# Patient Record
Sex: Male | Born: 1965 | Race: Black or African American | Hispanic: No | Marital: Single | Smoking: Former smoker
Health system: Southern US, Community
[De-identification: ages and names within clinical notes are randomized; demographics above are authoritative.]

## PROBLEM LIST (undated history)

## (undated) DIAGNOSIS — I10 Essential (primary) hypertension: Secondary | ICD-10-CM

---

## 1997-07-24 ENCOUNTER — Emergency Department (HOSPITAL_COMMUNITY): Admission: EM | Admit: 1997-07-24 | Discharge: 1997-07-24 | Payer: Self-pay | Admitting: Emergency Medicine

## 1997-07-27 ENCOUNTER — Emergency Department (HOSPITAL_COMMUNITY): Admission: EM | Admit: 1997-07-27 | Discharge: 1997-07-27 | Payer: Self-pay | Admitting: Emergency Medicine

## 2014-05-20 ENCOUNTER — Ambulatory Visit: Payer: Self-pay | Admitting: Dietician

## 2014-06-19 ENCOUNTER — Ambulatory Visit: Payer: Self-pay | Admitting: Dietician

## 2014-07-10 ENCOUNTER — Emergency Department (HOSPITAL_COMMUNITY)
Admission: EM | Admit: 2014-07-10 | Discharge: 2014-07-10 | Disposition: A | Discharge status: Home / Self Care | Payer: PRIVATE HEALTH INSURANCE | Attending: Emergency Medicine | Admitting: Emergency Medicine

## 2014-07-10 ENCOUNTER — Encounter (HOSPITAL_COMMUNITY): Payer: Self-pay | Admitting: Emergency Medicine

## 2014-07-10 DIAGNOSIS — I10 Essential (primary) hypertension: Secondary | ICD-10-CM | POA: Diagnosis not present

## 2014-07-10 DIAGNOSIS — Z87891 Personal history of nicotine dependence: Secondary | ICD-10-CM | POA: Diagnosis not present

## 2014-07-10 DIAGNOSIS — K088 Other specified disorders of teeth and supporting structures: Secondary | ICD-10-CM | POA: Insufficient documentation

## 2014-07-10 DIAGNOSIS — Z792 Long term (current) use of antibiotics: Secondary | ICD-10-CM | POA: Diagnosis not present

## 2014-07-10 DIAGNOSIS — K029 Dental caries, unspecified: Secondary | ICD-10-CM | POA: Diagnosis not present

## 2014-07-10 HISTORY — DX: Essential (primary) hypertension: I10

## 2014-07-10 MED ORDER — PENICILLIN V POTASSIUM 500 MG PO TABS: 500.0000 mg | ORAL_TABLET | Freq: Four times a day (QID) | ORAL | Status: AC

## 2014-07-10 MED ORDER — OXYCODONE-ACETAMINOPHEN 5-325 MG PO TABS
2.0000 | ORAL_TABLET | Freq: Once | ORAL | Status: AC
  Administered 2014-07-10: 2 via ORAL
  Filled 2014-07-10: qty 2

## 2014-07-10 NOTE — ED Provider Notes (Signed)
CSN: [id number]     Arrival date & time 07/10/14  [id number] History   First MD Initiated Contact with Patient 07/10/14 [phone]     Chief Complaint  Patient presents with  . Dental Pain  . Hypertension     (Consider location/radiation/quality/duration/timing/severity/associated sxs/prior Treatment) Patient is a 49 y.o. male presenting with tooth pain.  Dental Pain Location:  Upper Upper teeth location:  2/RU 2nd molar Quality:  Dull Severity:  Severe Onset quality:  Gradual Duration:  2 days Timing:  Constant Progression:  Worsening Chronicity:  New Context: dental caries   Context: not abscess   Relieved by:  Nothing Worsened by:  Hot food/drink, touching, jaw movement and pressure Associated symptoms: facial pain   Associated symptoms: no congestion, no fever and no trismus     Past Medical History  Diagnosis Date  . Hypertension    History reviewed. No pertinent past surgical history. No family history on file. History  Substance Use Topics  . Smoking status: Former Smoker    Quit date: 06/15/2014  . Smokeless tobacco: Not on file  . Alcohol Use: 7.2 oz/week    10 Cans of beer, 2 Shots of liquor per week    Review of Systems  Constitutional: Negative for fever.  HENT: Negative for congestion.   All other systems reviewed and are negative.     Allergies  Review of patient's allergies indicates no known allergies.  Home Medications   Prior to Admission medications   Medication Sig Start Date End Date Taking? Authorizing Provider  penicillin v potassium (VEETID) 500 MG tablet Take 1 tablet (500 mg total) by mouth 4 (four) times daily. 07/10/14 07/17/14  Mirian MoMatthew Constantin Hillery, MD   BP 150/107 mmHg  Pulse 76  Temp(Src) 98.7 F (37.1 C) (Oral)  Resp 22  Ht 5\' 9"  (1.753 m)  Wt 220 lb (99.791 kg)  BMI 32.47 kg/m2  SpO2 95% Physical Exam  Constitutional: He is oriented to person, place, and time. He appears well-developed and well-nourished.  HENT:  Head: Normocephalic  and atraumatic.  Mouth/Throat: No trismus in the jaw. Dental caries present. No dental abscesses.  Eyes: Conjunctivae and EOM are normal.  Neck: Normal range of motion. Neck supple.  Cardiovascular: Normal rate, regular rhythm and normal heart sounds.   Pulmonary/Chest: Effort normal and breath sounds normal. No respiratory distress.  Abdominal: He exhibits no distension. There is no tenderness. There is no rebound and no guarding.  Musculoskeletal: Normal range of motion.  Neurological: He is alert and oriented to person, place, and time.  Skin: Skin is warm and dry.  Vitals reviewed.   ED Course  Procedures (including critical care time) Labs Review Labs Reviewed - No data to display  Imaging Review No results found.   EKG Interpretation None      MDM   Final diagnoses:  Pain due to dental caries    49 y.o. male with pertinent PMH of HTN presents with uncomplicated dental pain as above.  Physical exam reassuring.  Mild HTN, normal given pt experiencing pain.  No signs of end organ damage on history or exam.  No signs of abscess or other complicating factor.  Offered dental block and refused.  Percocet given in ED, discussed departmental policy for no prescription.  DC home in stable condition.    I have reviewed all laboratory and imaging studies if ordered as above  1. Pain due to dental caries         Mirian MoMatthew Keilin Gamboa, MD 07/10/14  0728 

## 2014-07-10 NOTE — ED Notes (Signed)
Pt reports dental pain to R upper rear molar, 10/10.  Pt reports headaches r/t recently high BP, pt reports taking hypertension meds regularly. NAD noted at this time.

## 2014-07-10 NOTE — Discharge Instructions (Signed)
Dental Caries °Dental caries (also called tooth decay) is the most common oral disease. It can occur at any age but is more common in children and young adults.  °HOW DENTAL CARIES DEVELOPS  °The process of decay begins when bacteria and foods (particularly sugars and starches) combine in your mouth to produce plaque. Plaque is a substance that sticks to the hard, outer surface of a tooth (enamel). The bacteria in plaque produce acids that attack enamel. These acids may also attack the root surface of a tooth (cementum) if it is exposed. Repeated attacks dissolve these surfaces and create holes in the tooth (cavities). If left untreated, the acids destroy the other layers of the tooth.  °RISK FACTORS °· Frequent sipping of sugary beverages.   °· Frequent snacking on sugary and starchy foods, especially those that easily get stuck in the teeth.   °· Poor oral hygiene.   °· Dry mouth.   °· Substance abuse such as methamphetamine abuse.   °· Broken or poor-fitting dental restorations.   °· Eating disorders.   °· Gastroesophageal reflux disease (GERD).   °· Certain radiation treatments to the head and neck. °SYMPTOMS °In the early stages of dental caries, symptoms are seldom present. Sometimes white, chalky areas may be seen on the enamel or other tooth layers. In later stages, symptoms may include: °· Pits and holes on the enamel. °· Toothache after sweet, hot, or cold foods or drinks are consumed. °· Pain around the tooth. °· Swelling around the tooth. °DIAGNOSIS  °Most of the time, dental caries is detected during a regular dental checkup. A diagnosis is made after a thorough medical and dental history is taken and the surfaces of your teeth are checked for signs of dental caries. Sometimes special instruments, such as lasers, are used to check for dental caries. Dental X-ray exams may be taken so that areas not visible to the eye (such as between the contact areas of the teeth) can be checked for cavities.    °TREATMENT  °If dental caries is in its early stages, it may be reversed with a fluoride treatment or an application of a remineralizing agent at the dental office. Thorough brushing and flossing at home is needed to aid these treatments. If it is in its later stages, treatment depends on the location and extent of tooth destruction:  °· If a small area of the tooth has been destroyed, the destroyed area will be removed and cavities will be filled with a material such as gold, silver amalgam, or composite resin.   °· If a large area of the tooth has been destroyed, the destroyed area will be removed and a cap (crown) will be fitted over the remaining tooth structure.   °· If the center part of the tooth (pulp) is affected, a procedure called a root canal will be needed before a filling or crown can be placed.   °· If most of the tooth has been destroyed, the tooth may need to be pulled (extracted). °HOME CARE INSTRUCTIONS °You can prevent, stop, or reverse dental caries at home by practicing good oral hygiene. Good oral hygiene includes: °· Thoroughly cleaning your teeth at least twice a day with a toothbrush and dental floss.   °· Using a fluoride toothpaste. A fluoride mouth rinse may also be used if recommended by your dentist or health care provider.   °· Restricting the amount of sugary and starchy foods and sugary liquids you consume.   °· Avoiding frequent snacking on these foods and sipping of these liquids.   °· Keeping regular visits with   a dentist for checkups and cleanings. °PREVENTION  °· Practice good oral hygiene. °· Consider a dental sealant. A dental sealant is a coating material that is applied by your dentist to the pits and grooves of teeth. The sealant prevents food from being trapped in them. It may protect the teeth for several years. °· Ask about fluoride supplements if you live in a community without fluorinated water or with water that has a low fluoride content. Use fluoride supplements  as directed by your dentist or health care provider. °· Allow fluoride varnish applications to teeth if directed by your dentist or health care provider. °Document Released: 11/13/2001 Document Revised: 07/08/2013 Document Reviewed: 02/24/2012 °ExitCare® Patient Information ©2015 [employer]. This information is not intended to replace advice given to you by your health care provider. Make sure you discuss any questions you have with your health care provider. ° °Dental Pain °A tooth ache may be caused by cavities (tooth decay). Cavities expose the nerve of the tooth to air and hot or cold temperatures. It may come from an infection or abscess (also called a boil or furuncle) around your tooth. It is also often caused by dental caries (tooth decay). This causes the pain you are having. °DIAGNOSIS  °Your caregiver can diagnose this problem by exam. °TREATMENT  °· If caused by an infection, it may be treated with medications which kill germs (antibiotics) and pain medications as prescribed by your caregiver. Take medications as directed. °· Only take over-the-counter or prescription medicines for pain, discomfort, or fever as directed by your caregiver. °· Whether the tooth ache today is caused by infection or dental disease, you should see your dentist as soon as possible for further care. °SEEK MEDICAL CARE IF: °The exam and treatment you received today has been provided on an emergency basis only. This is not a substitute for complete medical or dental care. If your problem worsens or new problems (symptoms) appear, and you are unable to meet with your dentist, call or return to this location. °SEEK IMMEDIATE MEDICAL CARE IF:  °· You have a fever. °· You develop redness and swelling of your face, jaw, or neck. °· You are unable to open your mouth. °· You have severe pain uncontrolled by pain medicine. °MAKE SURE YOU:  °· Understand these instructions. °· Will watch your condition. °· Will get help right away if  you are not doing well or get worse. °Document Released: 02/21/2005 Document Revised: 05/16/2011 Document Reviewed: 10/10/2007 °ExitCare® Patient Information ©2015 [employer]. This information is not intended to replace advice given to you by your health care provider. Make sure you discuss any questions you have with your health care provider. ° °Emergency Department Resource Guide °1) Find a Doctor and Pay Out of Pocket °Although you won't have to find out who is covered by your insurance plan, it is a good idea to ask around and get recommendations. You will then need to call the office and see if the doctor you have chosen will accept you as a new patient and what types of options they offer for patients who are self-pay. Some doctors offer discounts or will set up payment plans for their patients who do not have insurance, but you will need to ask so you aren't surprised when you get to your appointment. ° °2) Contact Your Local Health Department °Not all health departments have doctors that can see patients for sick visits, but many do, so it is worth a call to see   if yours does. If you don't know where your local health department is, you can check in your phone book. The CDC also has a tool to help you locate your state's health department, and many state websites also have listings of all of their local health departments. ° °3) Find a Walk-in Clinic °If your illness is not likely to be very severe or complicated, you may want to try a walk in clinic. These are popping up all over the country in pharmacies, drugstores, and shopping centers. They're usually staffed by nurse practitioners or physician assistants that have been trained to treat common illnesses and complaints. They're usually fairly quick and inexpensive. However, if you have serious medical issues or chronic medical problems, these are probably not your best option. ° °No Primary Care Doctor: °- Call [facility] at  832-8000 - they can  help you locate a primary care doctor that  accepts your insurance, provides certain services, etc. °- Physician Referral Service- 1-800-533-3463 ° °Chronic Pain Problems: °Organization         Address  Phone   Notes  °[facility]  (336) 297-2271 Patients need to be referred by their primary care doctor.  ° °Medication Assistance: °Organization         Address  Phone   Notes  °[place] [facility] [address] °[place], [place] [address] (336) 641-8030 --Must be a resident of [place] °-- Must have NO insurance coverage whatsoever (no Medicaid/ Medicare, etc.) °-- The pt. MUST have a primary care doctor that directs their care regularly and follows them in the community °  °[facility]  (866) 331-1348   °[employer]  (888) 892-1162   ° °Agencies that provide inexpensive medical care: °Organization         Address  Phone   Notes  °[facility]  (336) 832-8035   °[facility]    (336) 832-7272   °[facility] [address] °[place], [place] [address] (336) 832-4777   °[facility] [address], °[place] (336) 271-4999   °[facility]    (336) 373-0678   °[facility]    (336) 272-1050   °[facility] ° [address], [place] Phone:  (336) 832-4444, Fax:  (336) 832-4440 Hours of Operation:  9 am - 6 pm, M-F.  Also accepts Medicaid/Medicare and self-pay.  °[facility] ° [address], [place] Phone: (336) 832-3150, Fax: (336) 832-3151. Hours of Operation:  8:30 am - 5:30 pm, M-F.  Also accepts Medicaid and self-pay.  °[facility] [place] [address], [place] Phone: (336) 878-6027   °[facility] [address], [place], [place] (336)723-1848, Ext. 123 Mondays & Thursdays: 7-9 AM.  First 15 patients are seen on a first come, first serve basis. °  ° °Medicaid-accepting [place] Providers: ° °Organization         Address  Phone   Notes  °[facility] [address], [place] (336) 641-2100 Also accepts self-pay patients.  °[facility] [address], [place] ° (336) 856-9996   °[facility] [address], [place] (336) 288-8857   °[facility] [address] [place] [address], [place] (336) 299-7000   °Veita Bland [address], [place]  ° (  336) 373-1557 Only accepts [place] Access Medicaid patients after they have their name applied to their card.  ° °Self-Pay (no insurance) in [place]: ° °Organization         Address  Phone   Notes  °Sickle Cell Patients, [facility] [address], [place] (336) 832-1970   °[facility] [address], [place] (336) 832-4400   °[facility] ° [address], [place] (336) 992-4800   °[facility]/Dr. Osei-Bonsu ° [address] [place] [address], [place] or [address], [place] (336) 841-8500 Phone number for both [place] and [place] locations is the same.  °[facility] [address], [place] (336) 299-0000   °[facility] [address] [place] [address], [place] or [address] (336) 852-7530 °(336) 878-2260   °[facility] [address], [place] (336) 350-1642, phone; (336) 294-5005, fax Sees patients 1st and 3rd Saturday of every month.  Must not qualify for public or private insurance (i.e. Medicaid, Medicare, [place] Health Choice, Veterans' Benefits) • Household income should be no more than 200% of the poverty level •The clinic cannot treat you if you are pregnant or think you are pregnant • Sexually transmitted diseases are not treated at the clinic.  ° ° °Dental Care: °Organization         Address  Phone  Notes  °[place] [facility]  [facility] [address], [place] (336) 641-6152 Accepts children up to age 21 who are enrolled in Medicaid or [place] Health Choice; pregnant women with a Medicaid card; and children who have applied for Medicaid or [place] Health Choice, but were declined, whose parents can pay a reduced fee at time of service.  °[facility] [place]  [address], [place] (336) 641-7733 Accepts children up to age 21 who are enrolled in Medicaid or [place] Health Choice; pregnant women with a Medicaid card; and children who have applied for Medicaid or [place] Health Choice, but were declined, whose parents can pay a reduced fee at time of service.  °[facility] ° [address], [place] (336) 641-4533 Patients are seen by appointment only. Walk-ins are not accepted. [facility] will see patients 18 years of age and older. °Monday - Tuesday (8am-5pm) °Most Wednesdays (8:30-5pm) °$30 per visit, cash only  °[facility] ° [address], [place] (336) 641-4533 Patients are seen by appointment only. Walk-ins are not accepted. [facility] will see patients 18 years of age and older. °One Wednesday Evening (Monthly: Volunteer Based).  $30 per visit, cash only  °[facility]  (919) 537-3737 for adults; Children under age 4, call Graduate Pediatric Dentistry at (919) [phone]. Children aged 4-14, please call (919) [phone] to request a pediatric application. ° Dental services are provided in all areas of dental care including fillings, crowns and bridges, complete and partial dentures, implants, gum treatment, root canals, and extractions. Preventive care is also provided. Treatment is provided to both adults and children. °Patients are selected via a lottery and there is often a waiting list. °  °[facility] [address], °[place] ° (336) 763-8833 [email or web address] °  °[facility] [address], [place],  (336)723-1848, Ext. 123 Second and Fourth Thursday of each month, opens at 6:30   AM; Clinic ends at 9 AM.  Patients are seen on a first-come first-served basis, and a limited number are seen during each clinic.  ° °[facility] ° [address], [place], [place] (336) 723-7904   Eligibility Requirements °You must have lived in [place], [place], or [place] counties for at least the last three months. °  You cannot be eligible for state or federal sponsored healthcare insurance, including Veterans Administration, Medicaid, or Medicare. °  You generally cannot be eligible for healthcare insurance through your employer.  °  How to apply: °Eligibility screenings are held every Tuesday and Wednesday afternoon from 1:00 pm until 4:00 pm. You do not need an appointment for the interview!  °[facility] [address], [place], [place] 336-631-2330   °[facility]  336-342-8273   °[facility]  336-703-3100   °[facility]  336-570-6415   ° °Behavioral Health Resources in the Community: °Intensive Outpatient Programs °Organization         Address  Phone  Notes  °[facility] [address], [place], [place] 336-878-6098   °[facility] [address], [place], [place] 336-832-9800   °[facility] [address], [place], [place] ° 336-882-2125   °[facility] [address],  °[place], [place] 1-800-853-5163 or 336-641-4981   °Substance Abuse Resources °Organization         Address  Phone  Notes  °[facility]  336-882-2125   °[facility]  336-784-9470   °[facility]  336-285-9073   °[facility]  336-845-3988   °Residential & Outpatient Substance Abuse Program  1-800-659-3381   °Psychological Services °Organization         Address  Phone  Notes  °[facility]  336-  832-9600   °[facility]  336- 378-7881   °[facility] [address], [place] 1-800-853-5163 or 336-641-4981   ° °Mobile Crisis Teams °Organization         Address  Phone  Notes  °[facility], Mobile Crisis Care Unit  1-877-626-1772   °Assertive °[facility] ° [address] [place], [place] 336-834-9664   °Sharon DeEsch [address] °[place] 336-554-5454   ° °Self-Help/Support Groups °Organization         Address  Phone             Notes  °[facility] - variety of support groups  336- 373-1402 Call for more information  °Narcotics Anonymous (NA), [facility] [address], °[place]  2 meetings at this location  ° °Residential Treatment Programs °Organization         Address  Phone  Notes  °[facility] [address],    °[place]  1-866-801-8205   °[facility] ° [address], [place], [place] 704-293-8524   °[facility] [address], [place] 336-845-3988 Admissions: 8am-3pm M-F  °[facility] [address]    °[place], [place] 336-841-1104   °[facility] [address], [place], [place] 336-379-7146   °[facility] [address]  °[place], [place] 336-285-9073   °[facility] - Intensive Outpatient [address], , [place] 336-852-3033   °[facility] [address]  °[place], [place] 1-877-615-2722 or 336-784-9470   °  [facility] [address] [place], [place] 336-227-7417 Accepts Medicaid  °[facility] [address]  °[place] 1-800-659-3381 Substance Abuse/Addiction Treatment  ° °[place] Behavioral Health Resources °Organization         Address  Phone  Notes  °[facility]  (888) 581-9988   °Julie Brannon, PhD [address] [place], [place]   (336) 349-5553 or  (336) 951-0000   °[facility]   [address] °[place], [place] (336) 349-4454   °[facility] [address], [place], [place] (336) 342-8316 Insurance/Medicaid/sponsorship through [facility]  °[facility] [address]                                    [place], [place] (336) 342-8316 Therapy/tele-psych/case  °[facility] [address]  ° [place],  (336) 349-2233    °Dr. Arfeen  (336) 349-4544   °[facility] [place]  [employer] [place] [facility] 1) [address],  °2) [address], [place] °3)  [address], [place] (336) 349-3220 °(336) 342-7768 ° °(336) 342-8140   °[place] [facility] (336) 342-1394 or (336) 342-3537 (After Hours)    ° °

## 2014-07-11 ENCOUNTER — Ambulatory Visit: Payer: Self-pay | Admitting: Dietician

## 2014-07-25 ENCOUNTER — Ambulatory Visit: Payer: Self-pay | Admitting: *Deleted

## 2014-08-25 ENCOUNTER — Ambulatory Visit: Payer: Self-pay | Admitting: Dietician

## 2016-11-09 ENCOUNTER — Encounter (HOSPITAL_COMMUNITY): Payer: Self-pay | Admitting: *Deleted

## 2016-11-09 ENCOUNTER — Emergency Department (HOSPITAL_COMMUNITY)
Admission: EM | Admit: 2016-11-09 | Discharge: 2016-11-09 | Disposition: A | Discharge status: Home / Self Care | Payer: Managed Care, Other (non HMO) | Attending: Emergency Medicine | Admitting: Emergency Medicine

## 2016-11-09 ENCOUNTER — Emergency Department (HOSPITAL_COMMUNITY): Payer: Managed Care, Other (non HMO)

## 2016-11-09 DIAGNOSIS — I10 Essential (primary) hypertension: Secondary | ICD-10-CM | POA: Diagnosis not present

## 2016-11-09 DIAGNOSIS — Z87891 Personal history of nicotine dependence: Secondary | ICD-10-CM | POA: Insufficient documentation

## 2016-11-09 DIAGNOSIS — W01198A Fall on same level from slipping, tripping and stumbling with subsequent striking against other object, initial encounter: Secondary | ICD-10-CM | POA: Insufficient documentation

## 2016-11-09 DIAGNOSIS — Y929 Unspecified place or not applicable: Secondary | ICD-10-CM | POA: Insufficient documentation

## 2016-11-09 DIAGNOSIS — S5002XA Contusion of left elbow, initial encounter: Secondary | ICD-10-CM | POA: Diagnosis not present

## 2016-11-09 DIAGNOSIS — S20211A Contusion of right front wall of thorax, initial encounter: Secondary | ICD-10-CM | POA: Insufficient documentation

## 2016-11-09 DIAGNOSIS — W19XXXA Unspecified fall, initial encounter: Secondary | ICD-10-CM

## 2016-11-09 DIAGNOSIS — Y9389 Activity, other specified: Secondary | ICD-10-CM | POA: Insufficient documentation

## 2016-11-09 DIAGNOSIS — Y999 Unspecified external cause status: Secondary | ICD-10-CM | POA: Insufficient documentation

## 2016-11-09 DIAGNOSIS — S4992XA Unspecified injury of left shoulder and upper arm, initial encounter: Secondary | ICD-10-CM | POA: Diagnosis present

## 2016-11-09 MED ORDER — NAPROXEN 500 MG PO TABS: 500.0000 mg | ORAL_TABLET | Freq: Two times a day (BID) | ORAL | 0 refills | Status: AC

## 2016-11-09 MED ORDER — CYCLOBENZAPRINE HCL 10 MG PO TABS: 10.0000 mg | ORAL_TABLET | Freq: Two times a day (BID) | ORAL | 0 refills | Status: AC | PRN

## 2016-11-09 NOTE — ED Triage Notes (Signed)
Pt reports tripping and falling yesterday in his yard. Has swelling and severe pain to left upper arm and right side abd pain. Denies difficulty urinating today. Ambulatory at triage.

## 2016-11-09 NOTE — Discharge Instructions (Signed)
X-rays showed no fracture.  You will be sore for several days.  Prescription for pain med and muscle relaxer.

## 2016-11-09 NOTE — ED Notes (Signed)
Patient transported to X-ray 

## 2016-11-09 NOTE — ED Provider Notes (Signed)
[facility]-EMERGENCY DEPT Provider Note   CSN: [id number] Arrival date & time: 11/09/16  [id number]     History   Chief Complaint Chief Complaint  Patient presents with  . Fall    HPI Margarette CanadaMichael T Novakowski is a 51 y.o. male.  Accidental trip and fal while working in his yard. Patient complains of pain in his left elbow and right ribs. No head or neck trauma.No other extremity injury. Severity of symptoms is moderate. Nothing makes symptoms better or worse.      Past Medical History:  Diagnosis Date  . Hypertension     There are no active problems to display for this patient.   History reviewed. No pertinent surgical history.     Home Medications    Prior to Admission medications   Medication Sig Start Date End Date Taking? Authorizing Provider  cyclobenzaprine (FLEXERIL) 10 MG tablet Take 1 tablet (10 mg total) by mouth 2 (two) times daily as needed for muscle spasms. 11/09/16   Donnetta Hutchingook, Lyana Asbill, MD  naproxen (NAPROSYN) 500 MG tablet Take 1 tablet (500 mg total) by mouth 2 (two) times daily. 11/09/16   Donnetta Hutchingook, Tykera Skates, MD    Family History History reviewed. No pertinent family history.  Social History Social History  Substance Use Topics  . Smoking status: Former Smoker    Quit date: 06/15/2014  . Smokeless tobacco: Not on file  . Alcohol use 7.2 oz/week    10 Cans of beer, 2 Shots of liquor per week     Allergies   Patient has no known allergies.   Review of Systems Review of Systems   Physical Exam Updated Vital Signs BP (!) 140/95   Pulse 82   Temp 98.1 F (36.7 C) (Oral)   Resp 16   SpO2 99%   Physical Exam  Constitutional: He is oriented to person, place, and time. He appears well-developed and well-nourished.  HENT:  Head: Normocephalic and atraumatic.  Eyes: Conjunctivae are normal.  Neck: Neck supple.  Cardiovascular: Normal rate and regular rhythm.   Pulmonary/Chest: Effort normal and breath sounds normal.  Tender right inferior anterior ribs  Abdominal:  Soft. Bowel sounds are normal.  Musculoskeletal:  Tender tip of left elbow.  Pain c ROM  Neurological: He is alert and oriented to person, place, and time.  Skin: Skin is warm and dry.  Psychiatric: He has a normal mood and affect. His behavior is normal.  Nursing note and vitals reviewed.    ED Treatments / Results  Labs (all labs ordered are listed, but only abnormal results are displayed) Labs Reviewed - No data to display  EKG  EKG Interpretation None       Radiology Dg Ribs Unilateral W/chest Right  Result Date: 11/09/2016 CLINICAL DATA:  Status post fall.  Left elbow pain.  Posterior pain. EXAM: RIGHT RIBS AND CHEST - 3+ VIEW COMPARISON:  None. FINDINGS: No fracture or other bone lesions are seen involving the ribs. There is no evidence of pneumothorax or pleural effusion. Both lungs are clear. Heart size and mediastinal contours are within normal limits. IMPRESSION: 1. No acute cardiopulmonary disease. 2.  No acute osseous injury of the right ribs. Electronically Signed   By: Elige KoHetal  Patel   On: 11/09/2016 10:31   Dg Elbow Complete Left  Result Date: 11/09/2016 CLINICAL DATA:  Pain following fall EXAM: LEFT ELBOW - COMPLETE 3+ VIEW COMPARISON:  None. FINDINGS: Frontal, lateral, and bilateral oblique views were obtained. There is mild generalized soft tissue swelling. There  is no evident fracture or dislocation. No joint effusion. There is a prominent spur arising from the olecranon process of the proximal ulna. There is a smaller coracoid process proximal ulnar spur. There is no appreciable joint space narrowing or erosion. IMPRESSION: Soft tissue swelling. Proximal ulnar spurs, most notably along the olecranon process. No fracture or dislocation. Electronically Signed   By: Bretta Bang III M.D.   On: 11/09/2016 10:32    Procedures Procedures (including critical care time)  Medications Ordered in ED Medications - No data to display   Initial Impression / Assessment  and Plan / ED Course  I have reviewed the triage vital signs and the nursing notes.  Pertinent labs & imaging results that were available during my care of the patient were reviewed by me and considered in my medical decision making (see chart for details).   Status post accidental trip and fall. No head or neck trauma. Complains of pain in left elbow and right ribs. Pain films negative. Discharge medications Flexeril 10 mg and Naprosyn 500 mg.   Final Clinical Impressions(s) / ED Diagnoses   Final diagnoses:  Fall, initial encounter  Contusion of left elbow, initial encounter  Rib contusion, right, initial encounter    New Prescriptions New Prescriptions   CYCLOBENZAPRINE (FLEXERIL) 10 MG TABLET    Take 1 tablet (10 mg total) by mouth 2 (two) times daily as needed for muscle spasms.   NAPROXEN (NAPROSYN) 500 MG TABLET    Take 1 tablet (500 mg total) by mouth 2 (two) times daily.     Donnetta Hutching, MD 11/12/16 [id number]

## 2018-06-09 ENCOUNTER — Other Ambulatory Visit: Payer: Self-pay

## 2018-06-09 ENCOUNTER — Emergency Department (HOSPITAL_COMMUNITY)
Admission: EM | Admit: 2018-06-09 | Discharge: 2018-06-10 | Disposition: A | Discharge status: Home / Self Care | Payer: Managed Care, Other (non HMO) | Attending: Emergency Medicine | Admitting: Emergency Medicine

## 2018-06-09 ENCOUNTER — Emergency Department (HOSPITAL_COMMUNITY): Payer: Managed Care, Other (non HMO)

## 2018-06-09 ENCOUNTER — Encounter (HOSPITAL_COMMUNITY): Payer: Self-pay | Admitting: Emergency Medicine

## 2018-06-09 DIAGNOSIS — Y902 Blood alcohol level of 40-59 mg/100 ml: Secondary | ICD-10-CM | POA: Insufficient documentation

## 2018-06-09 DIAGNOSIS — I10 Essential (primary) hypertension: Secondary | ICD-10-CM | POA: Diagnosis not present

## 2018-06-09 DIAGNOSIS — Z7982 Long term (current) use of aspirin: Secondary | ICD-10-CM | POA: Insufficient documentation

## 2018-06-09 DIAGNOSIS — Z87891 Personal history of nicotine dependence: Secondary | ICD-10-CM | POA: Diagnosis not present

## 2018-06-09 DIAGNOSIS — Z79899 Other long term (current) drug therapy: Secondary | ICD-10-CM | POA: Diagnosis not present

## 2018-06-09 DIAGNOSIS — R079 Chest pain, unspecified: Secondary | ICD-10-CM | POA: Diagnosis present

## 2018-06-09 LAB — HEPATIC FUNCTION PANEL
ALT: 27 U/L (ref 0–44)
AST: 29 U/L (ref 15–41)
Albumin: 3.7 g/dL (ref 3.5–5.0)
Alkaline Phosphatase: 59 U/L (ref 38–126)
Bilirubin, Direct: 0.3 mg/dL — ABNORMAL HIGH (ref 0.0–0.2)
Indirect Bilirubin: 0.5 mg/dL (ref 0.3–0.9)
Total Bilirubin: 0.8 mg/dL (ref 0.3–1.2)
Total Protein: 6.8 g/dL (ref 6.5–8.1)

## 2018-06-09 LAB — CBC
HCT: 43 % (ref 39.0–52.0)
Hemoglobin: 14.3 g/dL (ref 13.0–17.0)
MCH: 30.9 pg (ref 26.0–34.0)
MCHC: 33.3 g/dL (ref 30.0–36.0)
MCV: 92.9 fL (ref 80.0–100.0)
Platelets: 242 10*3/uL (ref 150–400)
RBC: 4.63 MIL/uL (ref 4.22–5.81)
RDW: 13.2 % (ref 11.5–15.5)
WBC: 10.6 10*3/uL — ABNORMAL HIGH (ref 4.0–10.5)
nRBC: 0 % (ref 0.0–0.2)

## 2018-06-09 LAB — ETHANOL: Alcohol, Ethyl (B): 46 mg/dL — ABNORMAL HIGH (ref <10)

## 2018-06-09 LAB — BASIC METABOLIC PANEL
Anion gap: 13 (ref 5–15)
BUN: 13 mg/dL (ref 6–20)
CO2: 18 mmol/L — ABNORMAL LOW (ref 22–32)
Calcium: 8.6 mg/dL — ABNORMAL LOW (ref 8.9–10.3)
Chloride: 106 mmol/L (ref 98–111)
Creatinine, Ser: 0.92 mg/dL (ref 0.61–1.24)
GFR calc Af Amer: >60 mL/min (ref >60)
GFR calc non Af Amer: >60 mL/min (ref >60)
Glucose, Bld: 89 mg/dL (ref 70–99)
Potassium: 3.8 mmol/L (ref 3.5–5.1)
Sodium: 137 mmol/L (ref 135–145)

## 2018-06-09 LAB — LIPASE, BLOOD: Lipase: 29 U/L (ref 11–51)

## 2018-06-09 LAB — TROPONIN I: Troponin I: <0.03 ng/mL (ref <0.03)

## 2018-06-09 MED ORDER — AEROCHAMBER PLUS FLO-VU LARGE MISC
1.0000 | Freq: Once | Status: DC
Start: 2018-06-09 — End: 2018-06-10

## 2018-06-09 MED ORDER — ALBUTEROL SULFATE HFA 108 (90 BASE) MCG/ACT IN AERS
2.0000 | INHALATION_SPRAY | Freq: Once | RESPIRATORY_TRACT | Status: AC
  Administered 2018-06-09: 2 via RESPIRATORY_TRACT
  Filled 2018-06-09: qty 6.7

## 2018-06-09 MED ORDER — MORPHINE SULFATE (PF) 4 MG/ML IV SOLN
4.0000 mg | Freq: Once | INTRAVENOUS | Status: AC
  Administered 2018-06-09: 4 mg via INTRAVENOUS
  Filled 2018-06-09: qty 1

## 2018-06-09 MED ORDER — SODIUM CHLORIDE 0.9% FLUSH
3.0000 mL | Freq: Once | INTRAVENOUS | Status: AC
  Administered 2018-06-09: 3 mL via INTRAVENOUS

## 2018-06-09 NOTE — ED Notes (Signed)
Nathaniel Castro pts sister wants an update @ [phone]

## 2018-06-09 NOTE — ED Triage Notes (Signed)
BIB EMS from home, reports CP onset yesterday that has worsened in past few hours. Central, non radiating, sharp in nature, 10/10. Given 324 ASA, 3NTG with no relief. Pt tearful in triage.

## 2018-06-09 NOTE — ED Provider Notes (Signed)
[facility] EMERGENCY DEPARTMENT Provider Note   CSN: [id number] Arrival date & time: 06/09/18  2216    History   Chief Complaint Chief Complaint  Patient presents with  . Chest Pain    HPI Nathaniel Castro is a 53 y.o. male with a hx of pretension presents to the Emergency Department complaining of gradual, persistent, progressively worsening central chest pain around 6 PM tonight.  Patient reports the pain is sharp and worse with inspiration.  Patient reports he had similar pain yesterday morning which resolved spontaneously.  He reports he mowed the grass this morning and did not have chest pain or shortness of breath.  He reports that chest pain and after taking a shot of liquor and drinking a beer tonight.  He reports pain is severe in nature.  He took 2 adult baby aspirins after calling 911 and was given 3 doses of the nitroglycerin that any relief of his pain.  He denies new activities, lifting or pulling.  He reports he is never had pain like this before.  He denies dyspnea on exertion, leg swelling, calf tenderness, periods of immobilization, history of DVT.  Also denies fever, chills, headache neck pain, shortness of breath,congestion, nausea, diaphoresis, abdominal pain, vomiting, weakness, dizziness, syncope.  He denies known sick contacts.    HPI: A 53 year old patient with a history of hypertension presents for evaluation of chest pain. Initial onset of pain was approximately 3-6 hours ago. The patient's chest pain is sharp and is not worse with exertion. The patient's chest pain is middle- or left-sided, is not well-localized, is not described as heaviness/pressure/tightness and does not radiate to the arms/jaw/neck. The patient does not complain of nausea and denies diaphoresis. The patient has no history of stroke, has no history of peripheral artery disease, has not smoked in the past 90 days, denies any history of treated diabetes, has no relevant family history  of coronary artery disease (first degree relative at less than age 45), has no history of hypercholesterolemia and does not have an elevated BMI (>=30).   The history is provided by the patient and medical records.    Past Medical History:  Diagnosis Date  . Hypertension     There are no active problems to display for this patient.   History reviewed. No pertinent surgical history.      Home Medications    Prior to Admission medications   Medication Sig Start Date End Date Taking? Authorizing Provider  amLODipine (NORVASC) 10 MG tablet Take 10 mg by mouth daily. 05/20/18  Yes [provider]  aspirin EC 325 MG tablet Take 325 mg by mouth every other day.   Yes [provider]  cyclobenzaprine (FLEXERIL) 10 MG tablet Take 1 tablet (10 mg total) by mouth 2 (two) times daily as needed for muscle spasms. Patient not taking: Reported on 06/09/2018 11/09/16   Donnetta Hutching, MD  naproxen (NAPROSYN) 500 MG tablet Take 1 tablet (500 mg total) by mouth 2 (two) times daily. Patient not taking: Reported on 06/09/2018 11/09/16   Donnetta Hutching, MD    Family History No family history on file.  Social History Social History   Tobacco Use  . Smoking status: Former Smoker    Last attempt to quit: 06/15/2014    Years since quitting: 3.9  . Smokeless tobacco: Never Used  Substance Use Topics  . Alcohol use: Yes    Alcohol/week: 12.0 standard drinks    Types: 10 Cans of beer, 2  Shots of liquor per week  . Drug use: Not Currently     Allergies   Patient has no known allergies.   Review of Systems Review of Systems  Constitutional: Negative for appetite change, diaphoresis, fatigue, fever and unexpected weight change.  HENT: Negative for mouth sores.   Eyes: Negative for visual disturbance.  Respiratory: Negative for cough, chest tightness, shortness of breath and wheezing.   Cardiovascular: Positive for chest pain.  Gastrointestinal: Negative for abdominal pain,  constipation, diarrhea, nausea and vomiting.  Endocrine: Negative for polydipsia, polyphagia and polyuria.  Genitourinary: Negative for dysuria, frequency, hematuria and urgency.  Musculoskeletal: Negative for back pain and neck stiffness.  Skin: Negative for rash.  Allergic/Immunologic: Negative for immunocompromised state.  Neurological: Negative for syncope, light-headedness and headaches.  Hematological: Does not bruise/bleed easily.  Psychiatric/Behavioral: Negative for sleep disturbance. The patient is nervous/anxious.      Physical Exam Updated Vital Signs BP 128/88   Pulse 81   Temp 98.7 F (37.1 C) (Oral)   Resp 16   Ht 5\' 9"  (1.753 m)   Wt 93.9 kg   SpO2 98%   BMI 30.57 kg/m   Physical Exam Vitals signs and nursing note reviewed.  Constitutional:      General: He is not in acute distress.    Appearance: He is not diaphoretic.  HENT:     Head: Normocephalic.  Eyes:     General: No scleral icterus.    Conjunctiva/sclera: Conjunctivae normal.  Neck:     Musculoskeletal: Normal range of motion.  Cardiovascular:     Rate and Rhythm: Normal rate and regular rhythm.     Pulses: Normal pulses.          Radial pulses are 2+ on the right side and 2+ on the left side.  Pulmonary:     Effort: No tachypnea, accessory muscle usage, prolonged expiration, respiratory distress or retractions.     Breath sounds: No stridor.     Comments: Equal chest rise. No increased work of breathing. Abdominal:     General: There is no distension.     Palpations: Abdomen is soft.     Tenderness: There is no abdominal tenderness. There is no guarding or rebound.  Musculoskeletal:     Comments: Moves all extremities equally and without difficulty.  Skin:    General: Skin is warm and dry.     Capillary Refill: Capillary refill takes less than 2 seconds.  Neurological:     Mental Status: He is alert.     GCS: GCS eye subscore is 4. GCS verbal subscore is 5. GCS motor subscore is 6.      Comments: Speech is clear and goal oriented.  Psychiatric:        Mood and Affect: Mood is anxious.      ED Treatments / Results  Labs (all labs ordered are listed, but only abnormal results are displayed) Labs Reviewed  BASIC METABOLIC PANEL - Abnormal; Notable for the following components:      Result Value   CO2 18 (*)    Calcium 8.6 (*)    All other components within normal limits  CBC - Abnormal; Notable for the following components:   WBC 10.6 (*)    All other components within normal limits  HEPATIC FUNCTION PANEL - Abnormal; Notable for the following components:   Bilirubin, Direct 0.3 (*)    All other components within normal limits  ETHANOL - Abnormal; Notable for the following components:  Alcohol, Ethyl (B) 46 (*)    All other components within normal limits  TROPONIN I  LIPASE, BLOOD  TROPONIN I    EKG EKG Interpretation  Date/Time:  Saturday June 09 2018 22:22:04 EDT Ventricular Rate:  83 PR Interval:    QRS Duration: 101 QT Interval:  365 QTC Calculation: 429 R Axis:   -8 Text Interpretation:  Sinus rhythm Probable left atrial enlargement No old tracing to compare Confirmed by Melene Plan [phone]) on 06/09/2018 10:34:39 PM   EKG Interpretation  Date/Time:  Saturday June 09 2018 23:41:12 EDT Ventricular Rate:  74 PR Interval:    QRS Duration: 107 QT Interval:  399 QTC Calculation: 443 R Axis:   22 Text Interpretation:  Sinus arrhythmia No significant change since last tracing Confirmed by Marily Memos [phone]) on 06/10/2018 2:46:49 AM       Radiology Ct Angio Chest Pe W And/or Wo Contrast  Result Date: 06/10/2018 CLINICAL DATA:  Chest pain. EXAM: CT ANGIOGRAPHY CHEST WITH CONTRAST TECHNIQUE: Multidetector CT imaging of the chest was performed using the standard protocol during bolus administration of intravenous contrast. Multiplanar CT image reconstructions and MIPs were obtained to evaluate the vascular anatomy. CONTRAST:  75mL OMNIPAQUE IOHEXOL  350 MG/ML SOLN COMPARISON:  Chest x-ray June 09, 2018 FINDINGS: Cardiovascular: Satisfactory opacification of the pulmonary arteries to the segmental level. No evidence of pulmonary embolism. Normal heart size. No pericardial effusion. Mediastinum/Nodes: No enlarged mediastinal, hilar, or axillary lymph nodes. Thyroid gland, trachea, and esophagus demonstrate no significant findings. Lungs/Pleura: Lungs are clear. No pleural effusion or pneumothorax. Upper Abdomen: No acute abnormality. Musculoskeletal: No chest wall abnormality. No acute or significant osseous findings. Review of the MIP images confirms the above findings. IMPRESSION: No cause for the patient's symptoms identified. No pulmonary emboli. Electronically Signed   By: Gerome Sam III M.D   On: 06/10/2018 00:52   Dg Chest Portable 1 View  Result Date: 06/09/2018 CLINICAL DATA:  Chest pain EXAM: PORTABLE CHEST 1 VIEW COMPARISON:  11/09/2016 FINDINGS: The heart size and mediastinal contours are within normal limits. Both lungs are clear. The visualized skeletal structures are unremarkable. IMPRESSION: No active disease. Electronically Signed   By: Deatra Robinson M.D.   On: 06/09/2018 23:31    Procedures Procedures (including critical care time)  Medications Ordered in ED Medications  AeroChamber Plus Flo-Vu Large MISC 1 each (has no administration in time range)  sodium chloride flush (NS) 0.9 % injection 3 mL (3 mLs Intravenous Given 06/09/18 2253)  morphine 4 MG/ML injection 4 mg (4 mg Intravenous Given 06/09/18 2249)  albuterol (PROVENTIL HFA;VENTOLIN HFA) 108 (90 Base) MCG/ACT inhaler 2 puff (2 puffs Inhalation Given 06/09/18 2347)  iohexol (OMNIPAQUE) 350 MG/ML injection 75 mL (75 mLs Intravenous Contrast Given 06/10/18 0023)  morphine 4 MG/ML injection 4 mg (4 mg Intravenous Given 06/10/18 0104)     Initial Impression / Assessment and Plan / ED Course  I have reviewed the triage vital signs and the nursing notes.  Pertinent labs &  imaging results that were available during my care of the patient were reviewed by me and considered in my medical decision making (see chart for details).  Clinical Course as of Jun 10 343  Sat Jun 09, 2018  2321 The patient was discussed with, ECG reviewed by and evaluated by Dr. Clayborne Dana who agrees with the treatment plan.    [HM]  2335 Pt becomes tachycardic with movement   [HM]  Sun Jun 10, 2018  0147 Pt  reports he is chest pain free at this time and feeling better.   [HM]    Clinical Course User Index [HM] Boaz Berisha, Sherre Scarlet Score: 3   DYSHON PHILBIN was evaluated in Emergency Department on 06/10/2018 for the symptoms described in the history of present illness. He was evaluated in the context of the global COVID-19 pandemic, which necessitated consideration that the patient might be at risk for infection with the SARS-CoV-2 virus that causes COVID-19. Institutional protocols and algorithms that pertain to the evaluation of patients at risk for COVID-19 are in a state of rapid change based on information released by regulatory bodies including the CDC and federal and state organizations. These policies and algorithms were followed during the patient's care in the ED.  Patient presents for anterior chest pain onset several hours prior to arrival.  Patient was drinking and does have slightly elevated alcohol level.  He is not medically intoxicated at this time.  Labs are reassuring.  Initial and delta troponin are negative.  As chest pain was atypical, CT angiogram was not performed.  No evidence of pulmonary embolism or groundglass opacities to suggest viral pneumonia.  Patient is chest pain has resolved completely and has not returned.  He is well-appearing.  EKG remains nonischemic.  Highly doubt acute coronary syndrome at this time.  No current evidence for Boerhaave's, pancreatitis.  Patient will be discharged home with close primary care follow-up.  He is to return  immediately to the emergency department for new or worsening symptoms.  Patient states understanding and is in agreement with the plan.  Final Clinical Impressions(s) / ED Diagnoses   Final diagnoses:  Central chest pain    ED Discharge Orders    None       Mardene Sayer Boyd Kerbs 06/10/18 0345    Mesner, Barbara Cower, MD 06/10/18 [phone]

## 2018-06-10 ENCOUNTER — Emergency Department (HOSPITAL_COMMUNITY): Payer: Managed Care, Other (non HMO)

## 2018-06-10 LAB — TROPONIN I: Troponin I: <0.03 ng/mL (ref <0.03)

## 2018-06-10 MED ORDER — MORPHINE SULFATE (PF) 4 MG/ML IV SOLN
4.0000 mg | Freq: Once | INTRAVENOUS | Status: AC
  Administered 2018-06-10: 4 mg via INTRAVENOUS
  Filled 2018-06-10: qty 1

## 2018-06-10 MED ORDER — IOHEXOL 350 MG/ML SOLN
75.0000 mL | Freq: Once | INTRAVENOUS | Status: AC | PRN
  Administered 2018-06-10: 75 mL via INTRAVENOUS

## 2018-06-10 NOTE — ED Notes (Signed)
Patient verbalizes understanding of discharge instructions. Opportunity for questioning and answers were provided. Armband removed by staff, pt discharged from ED, ambulatory to lobby to wait for ride.  

## 2018-06-10 NOTE — Discharge Instructions (Addendum)
1. Medications: usual home medications 2. Treatment: rest, drink plenty of fluids,  3. Follow Up: Please followup with your primary doctor in 2-3 days for discussion of your diagnoses and further evaluation after today's visit; if you do not have a primary care doctor use the resource guide provided to find one; Please return to the ER for return of chest pain, new or worsening symptoms or any other concerns.

## 2018-06-10 NOTE — ED Provider Notes (Signed)
Medical screening examination/treatment/procedure(s) were conducted as a shared visit with non-physician practitioner(s) and myself.  I personally evaluated the patient during the encounter.  EKG Interpretation  Date/Time:  Saturday June 09 2018 22:22:04 EDT Ventricular Rate:  83 PR Interval:    QRS Duration: 101 QT Interval:  365 QTC Calculation: 429 R Axis:   -8 Text Interpretation:  Sinus rhythm Probable left atrial enlargement No old tracing to compare Confirmed by Melene Plan [phone]) on 06/09/2018 10:34:39 PM     Ziona Wickens, Barbara Cower, MD 06/10/18 0246

## 2018-10-01 IMAGING — DX DG RIBS W/ CHEST 3+V*R*
3 series · 3 of 3 positions shown · non-contrast
Comparison: None.

CLINICAL DATA: Status post fall.  Left elbow pain.  Posterior pain.

EXAM:
RIGHT RIBS AND CHEST - 3+ VIEW

[w chest pa]
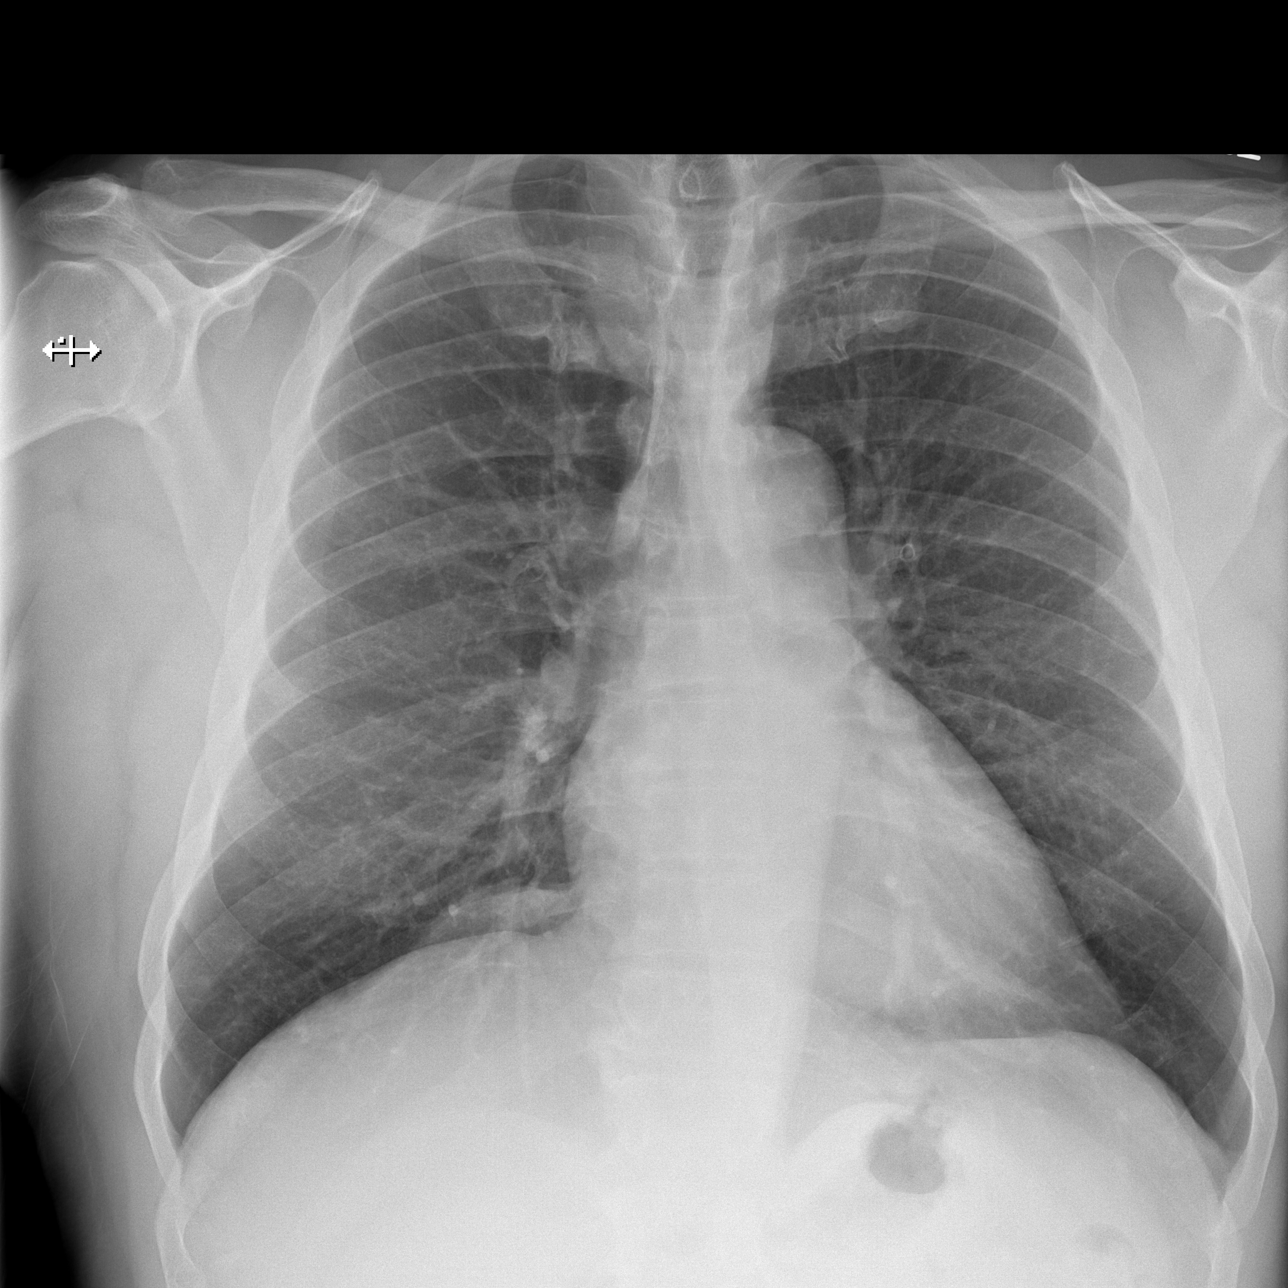

[w ribs ap lower right]
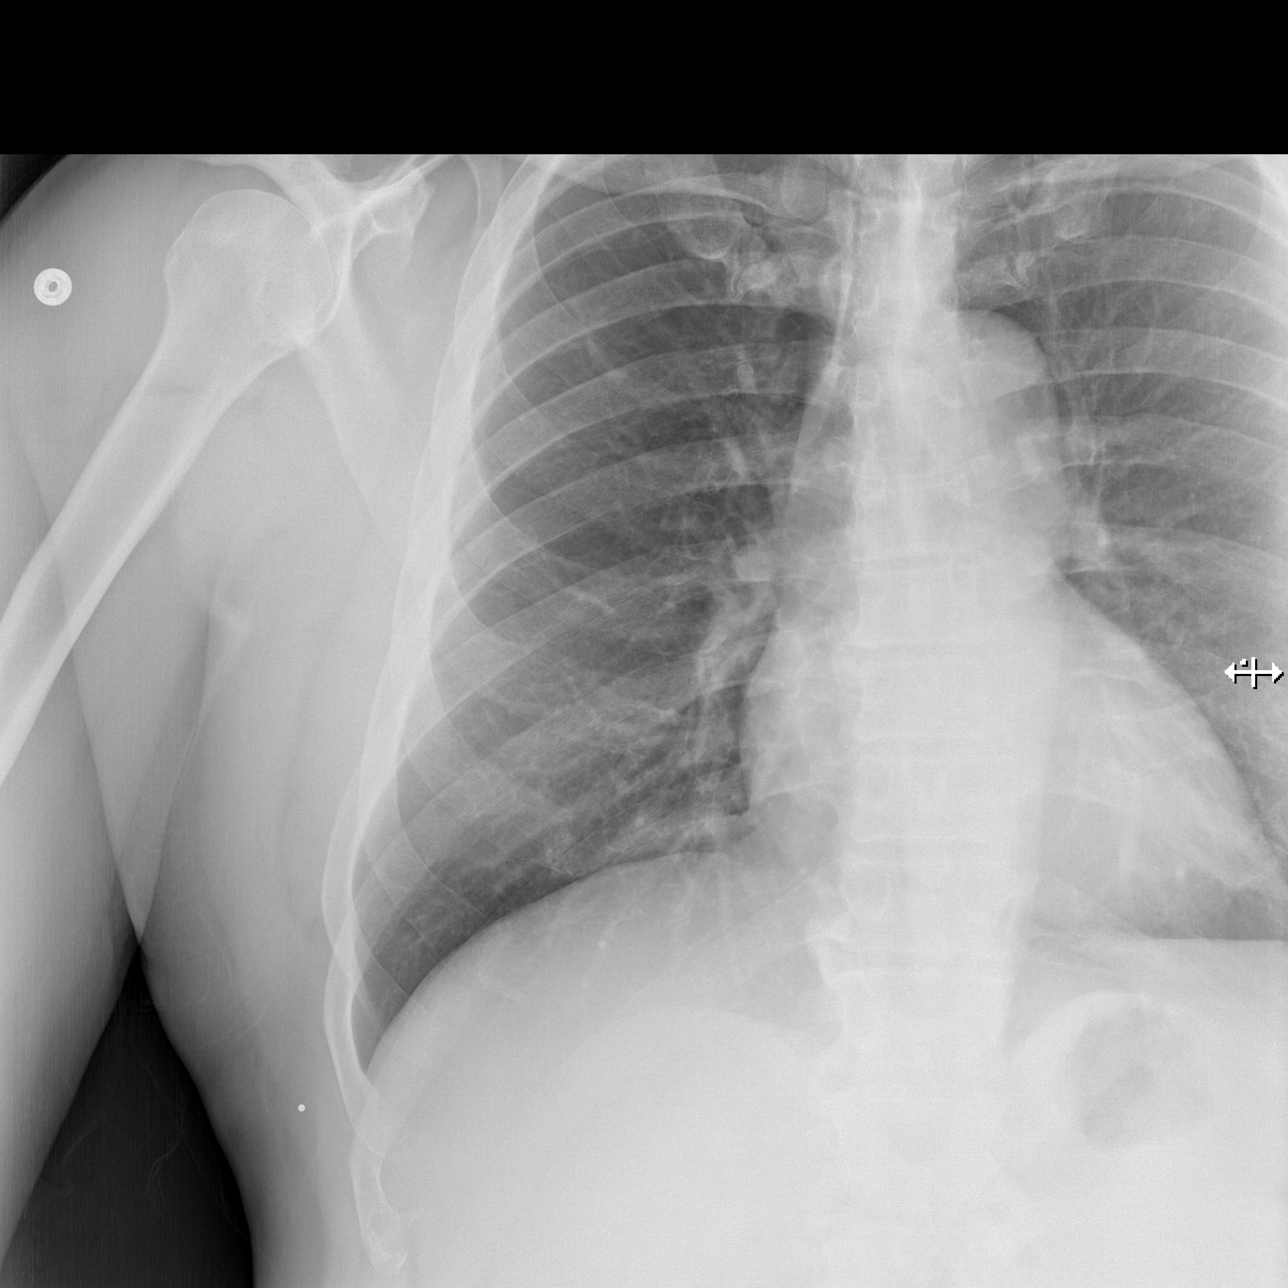

[w ribs obl right]
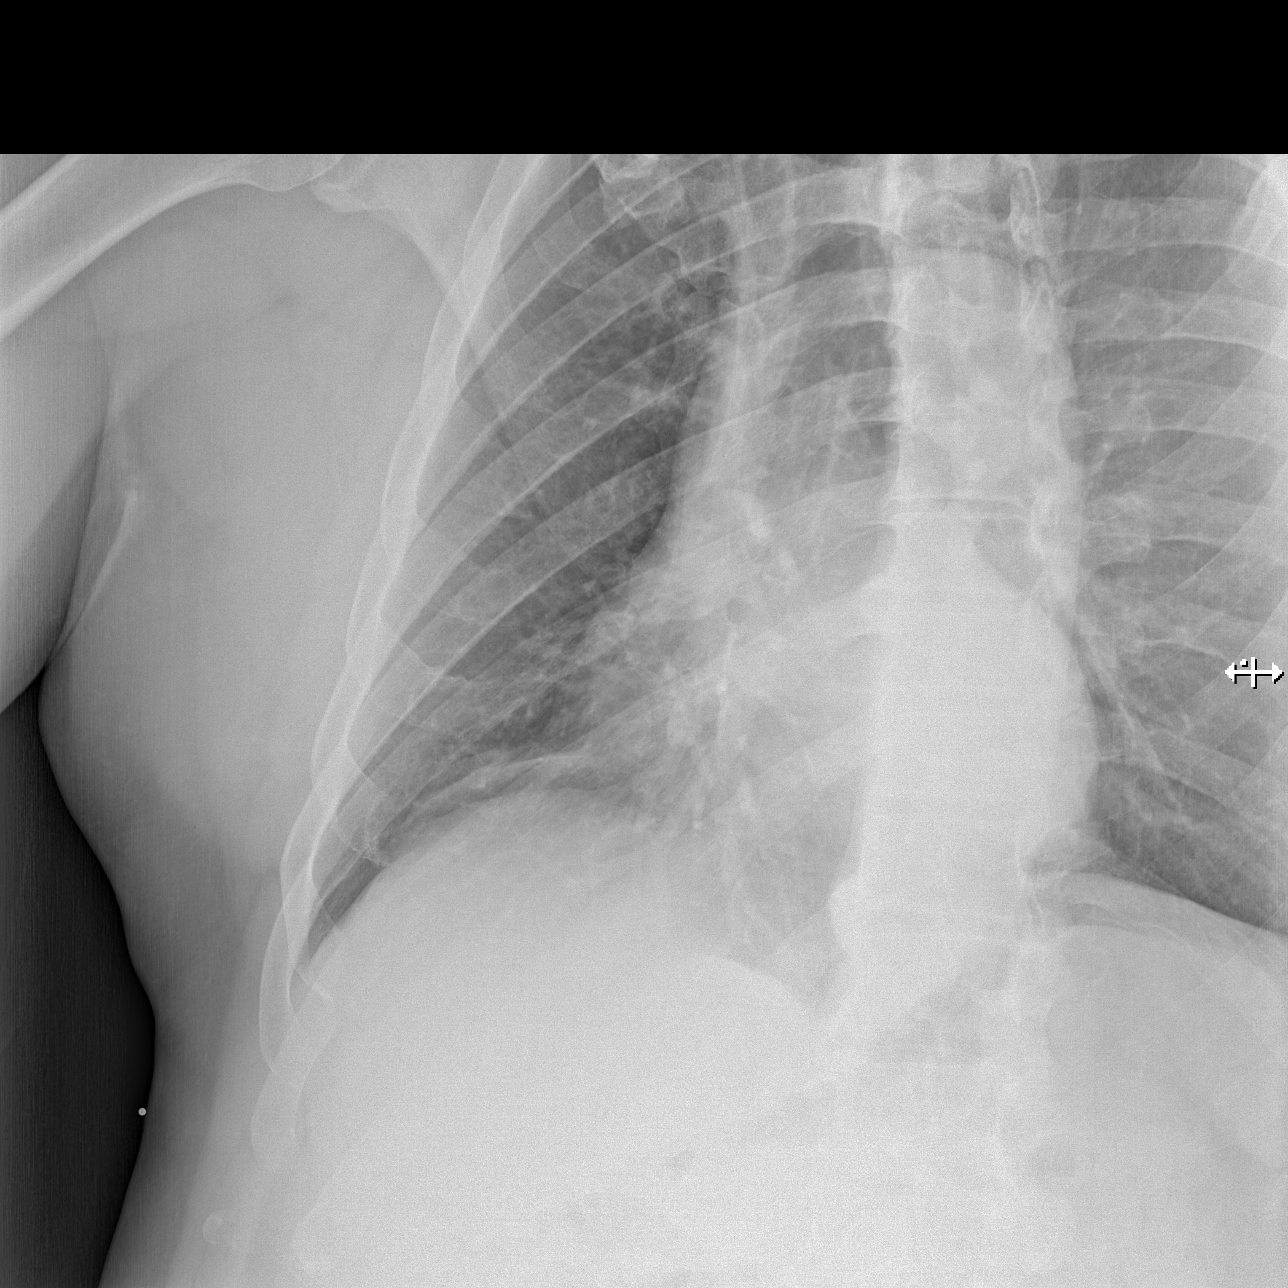

[3 of 3 positions shown; findings below may reference images not displayed]

FINDINGS: No fracture or other bone lesions are seen involving the ribs. There
is no evidence of pneumothorax or pleural effusion. Both lungs are
clear. Heart size and mediastinal contours are within normal limits.
IMPRESSION: 1. No acute cardiopulmonary disease.
2.  No acute osseous injury of the right ribs.

## 2018-10-01 IMAGING — DX DG ELBOW COMPLETE 3+V*L*
4 series · 4 of 4 positions shown · non-contrast
Comparison: None.

CLINICAL DATA: Pain following fall

EXAM:
LEFT ELBOW - COMPLETE 3+ VIEW

[x elbow ap left]
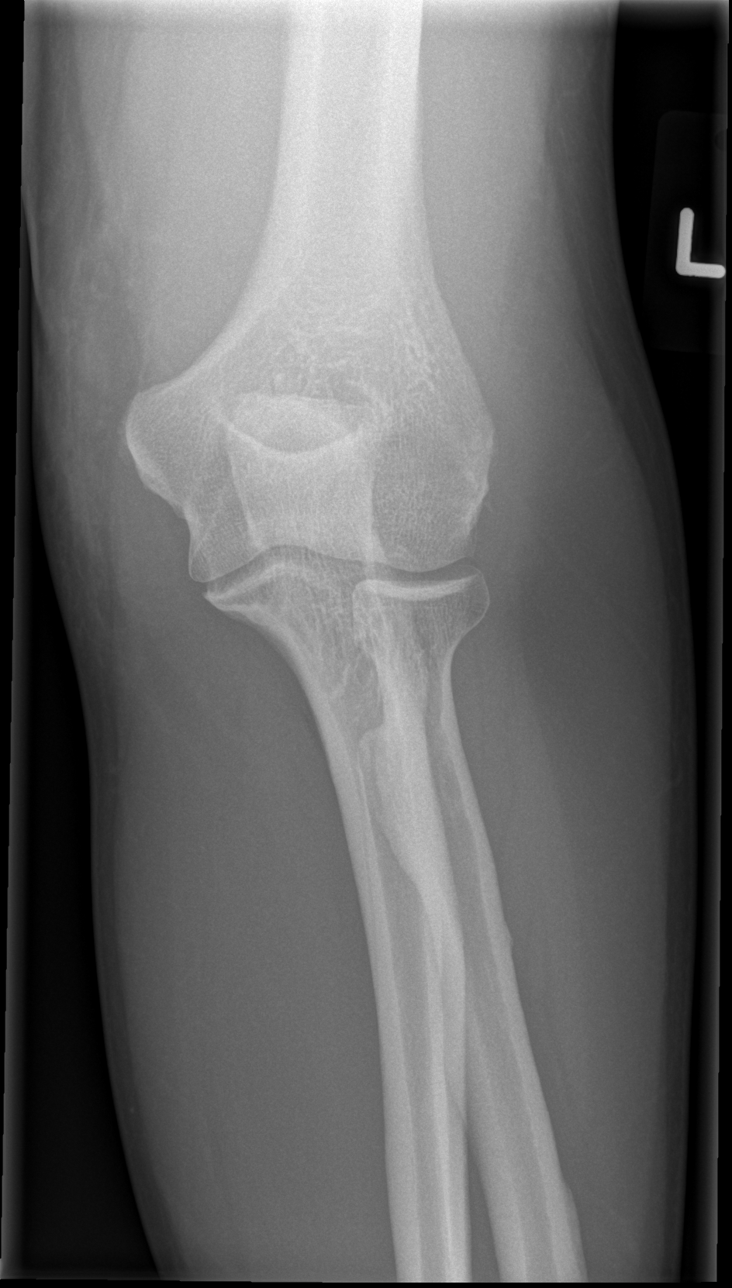

[x elbow obl left (1 of 2)]
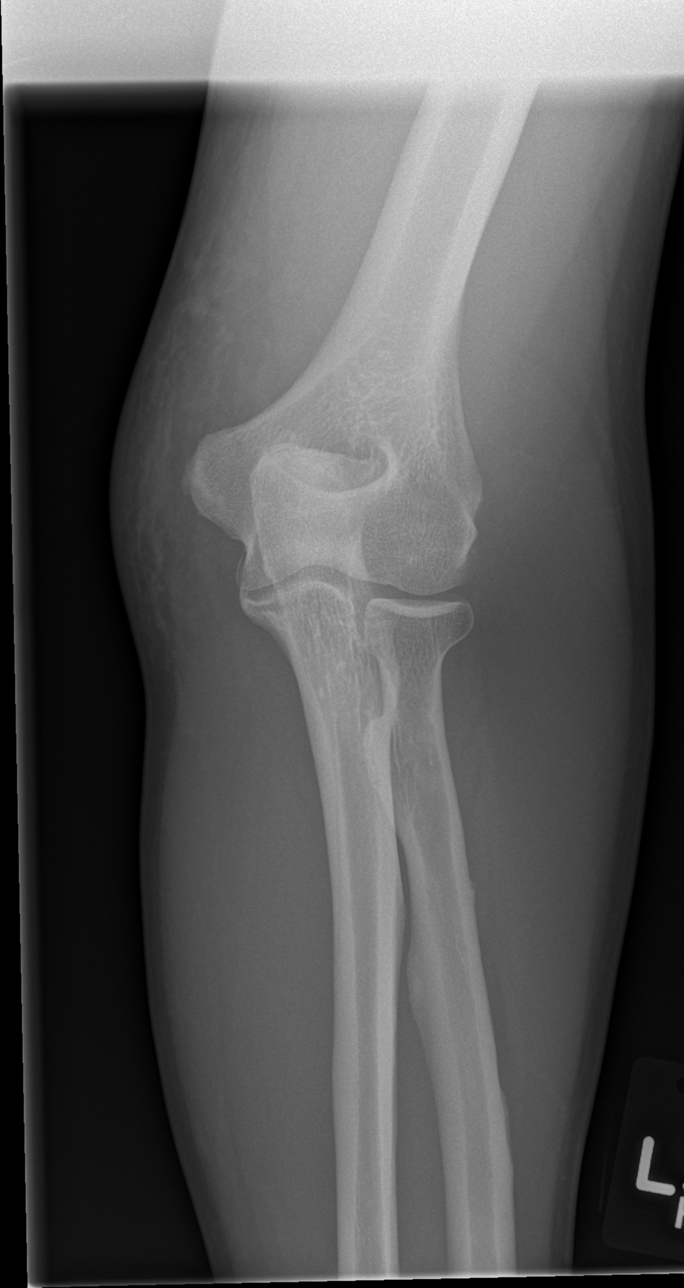

[x elbow obl left (2 of 2)]
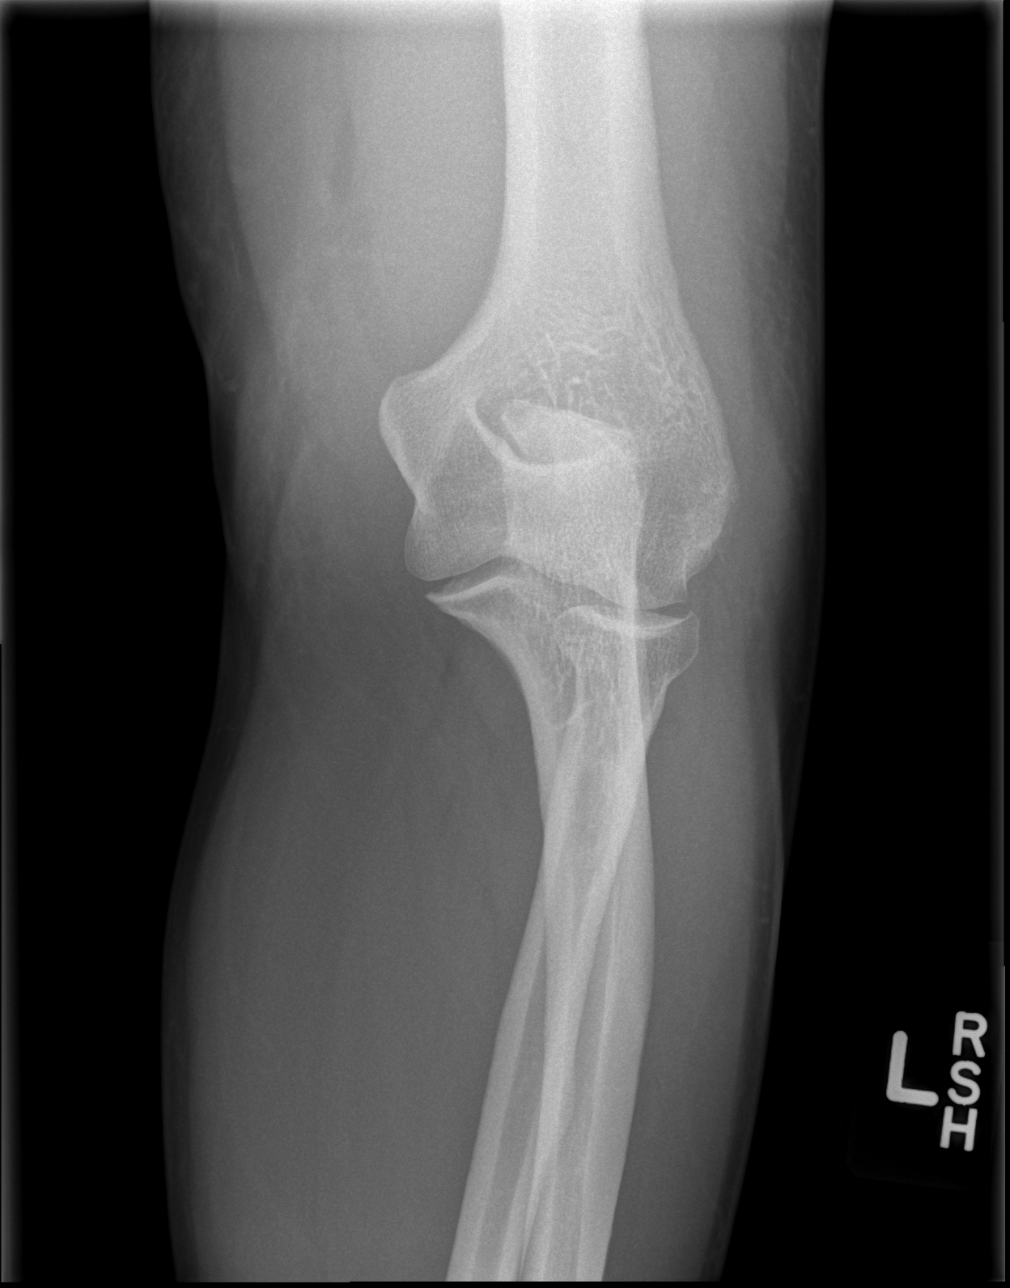

[x elbow lat left]
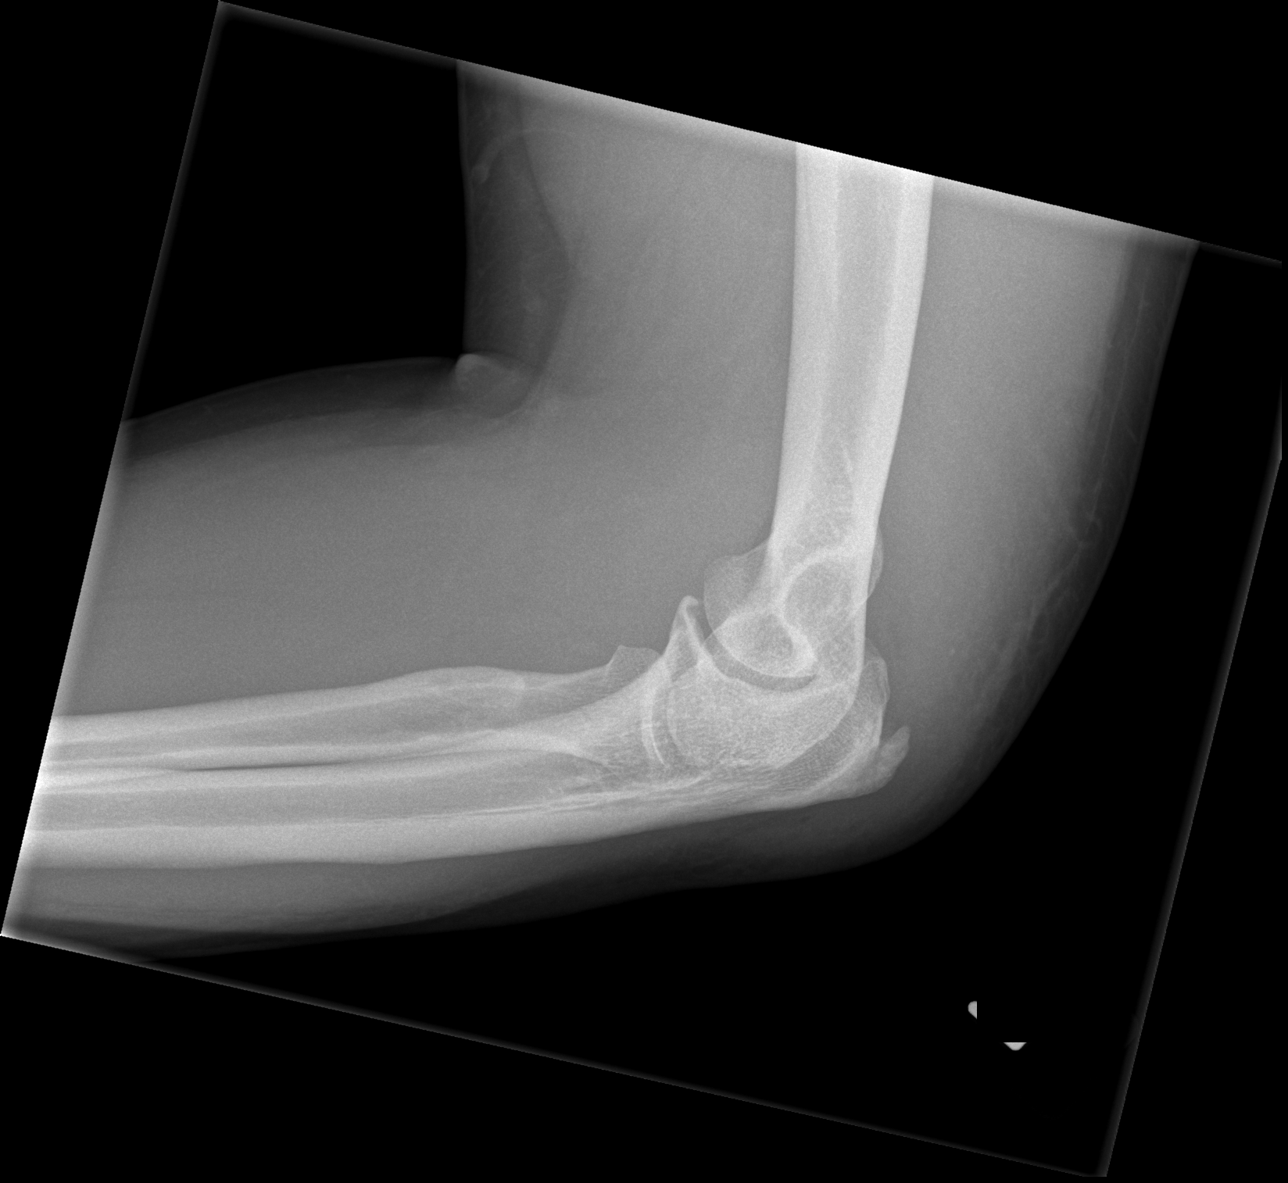

[4 of 4 positions shown; findings below may reference images not displayed]

FINDINGS: Frontal, lateral, and bilateral oblique views were obtained. There
is mild generalized soft tissue swelling. There is no evident
fracture or dislocation. No joint effusion. There is a prominent
spur arising from the olecranon process of the proximal ulna. There
is a smaller coracoid process proximal ulnar spur. There is no
appreciable joint space narrowing or erosion.
IMPRESSION: Soft tissue swelling. Proximal ulnar spurs, most notably along the
olecranon process. No fracture or dislocation.

## 2020-04-30 IMAGING — DX PORTABLE CHEST - 1 VIEW
1 series · 2 of 2 positions shown · non-contrast
Comparison: 11/09/2016

CLINICAL DATA: Chest pain

EXAM:
PORTABLE CHEST 1 VIEW

[Series 1: chest ap · 0.14mm/px · 2 of 2 slices shown]
[im 1/2]
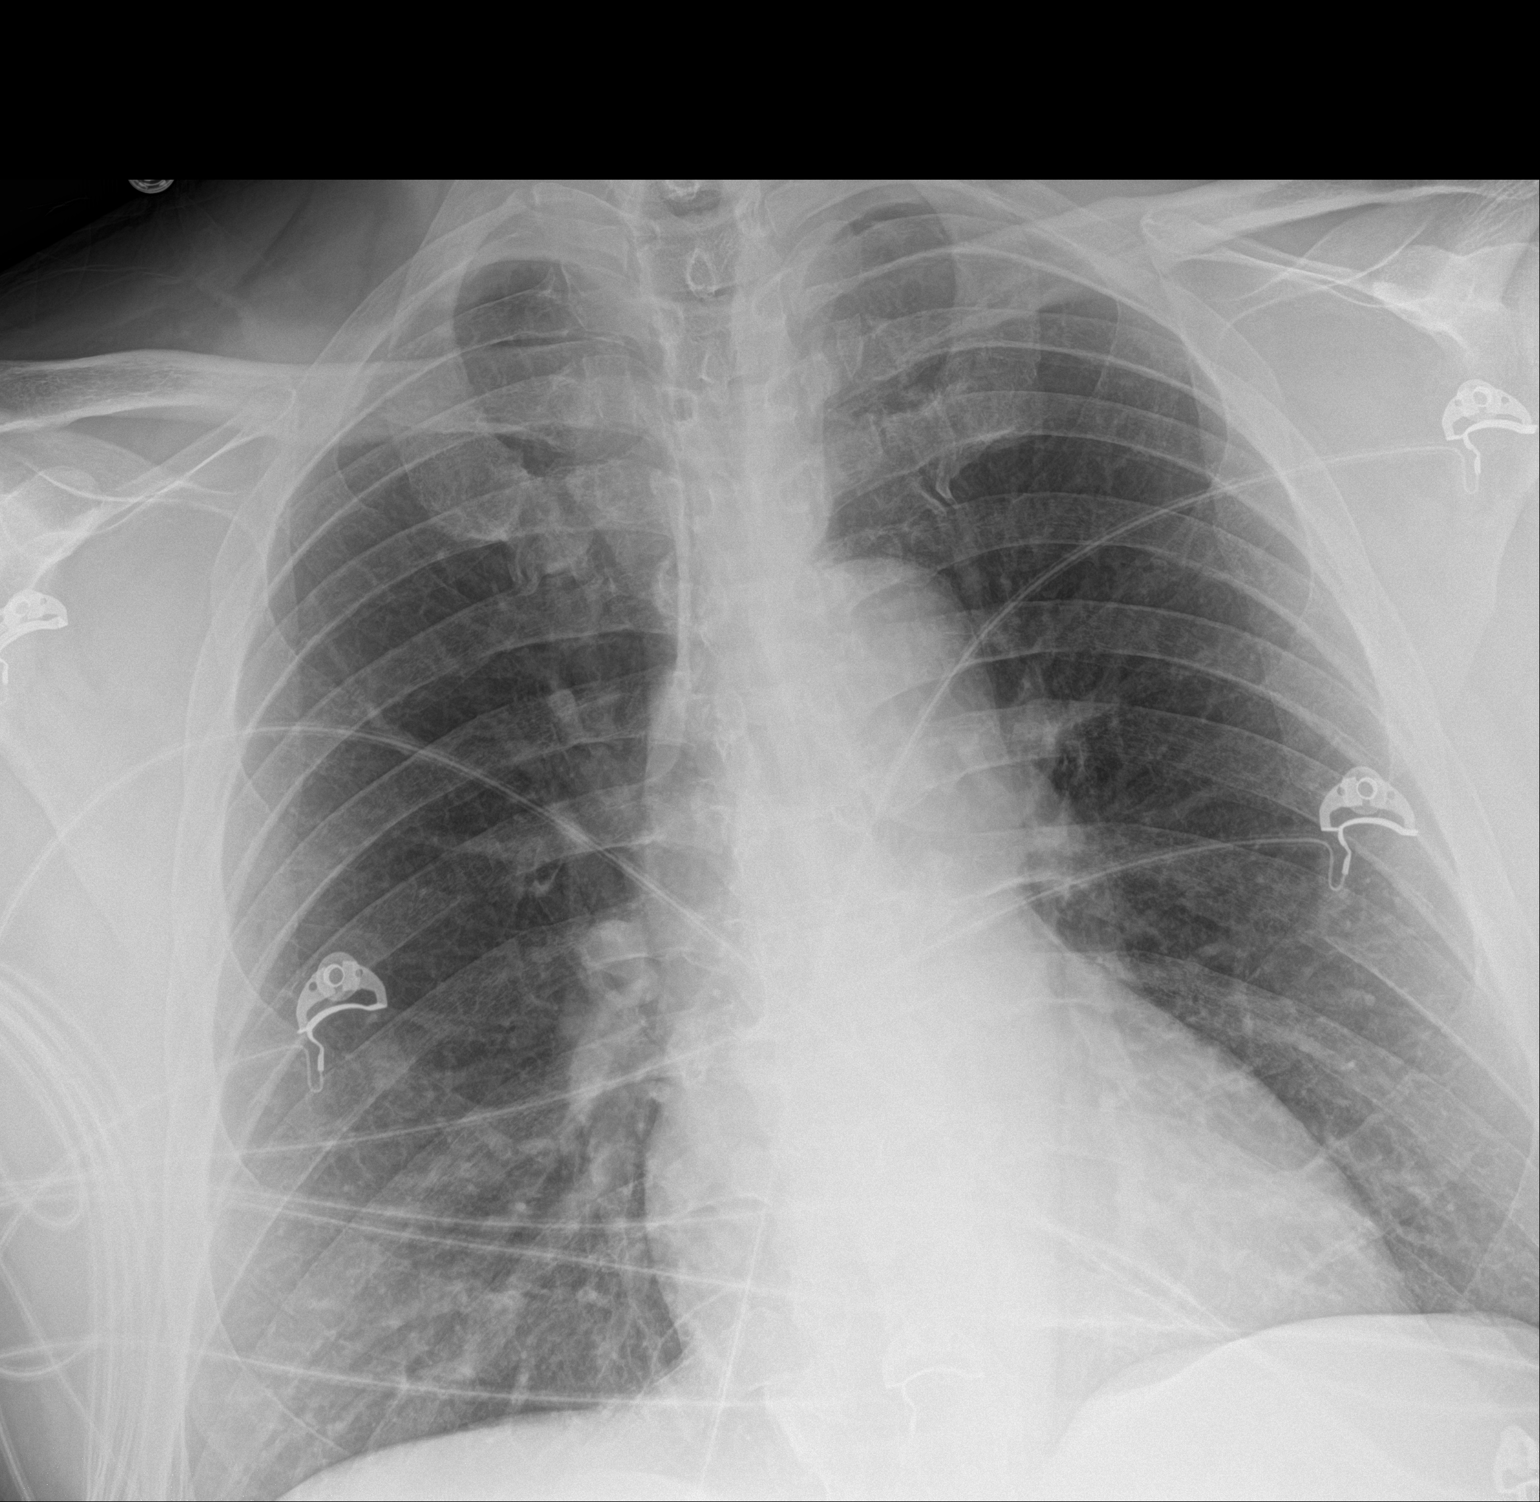
[im 2/2]
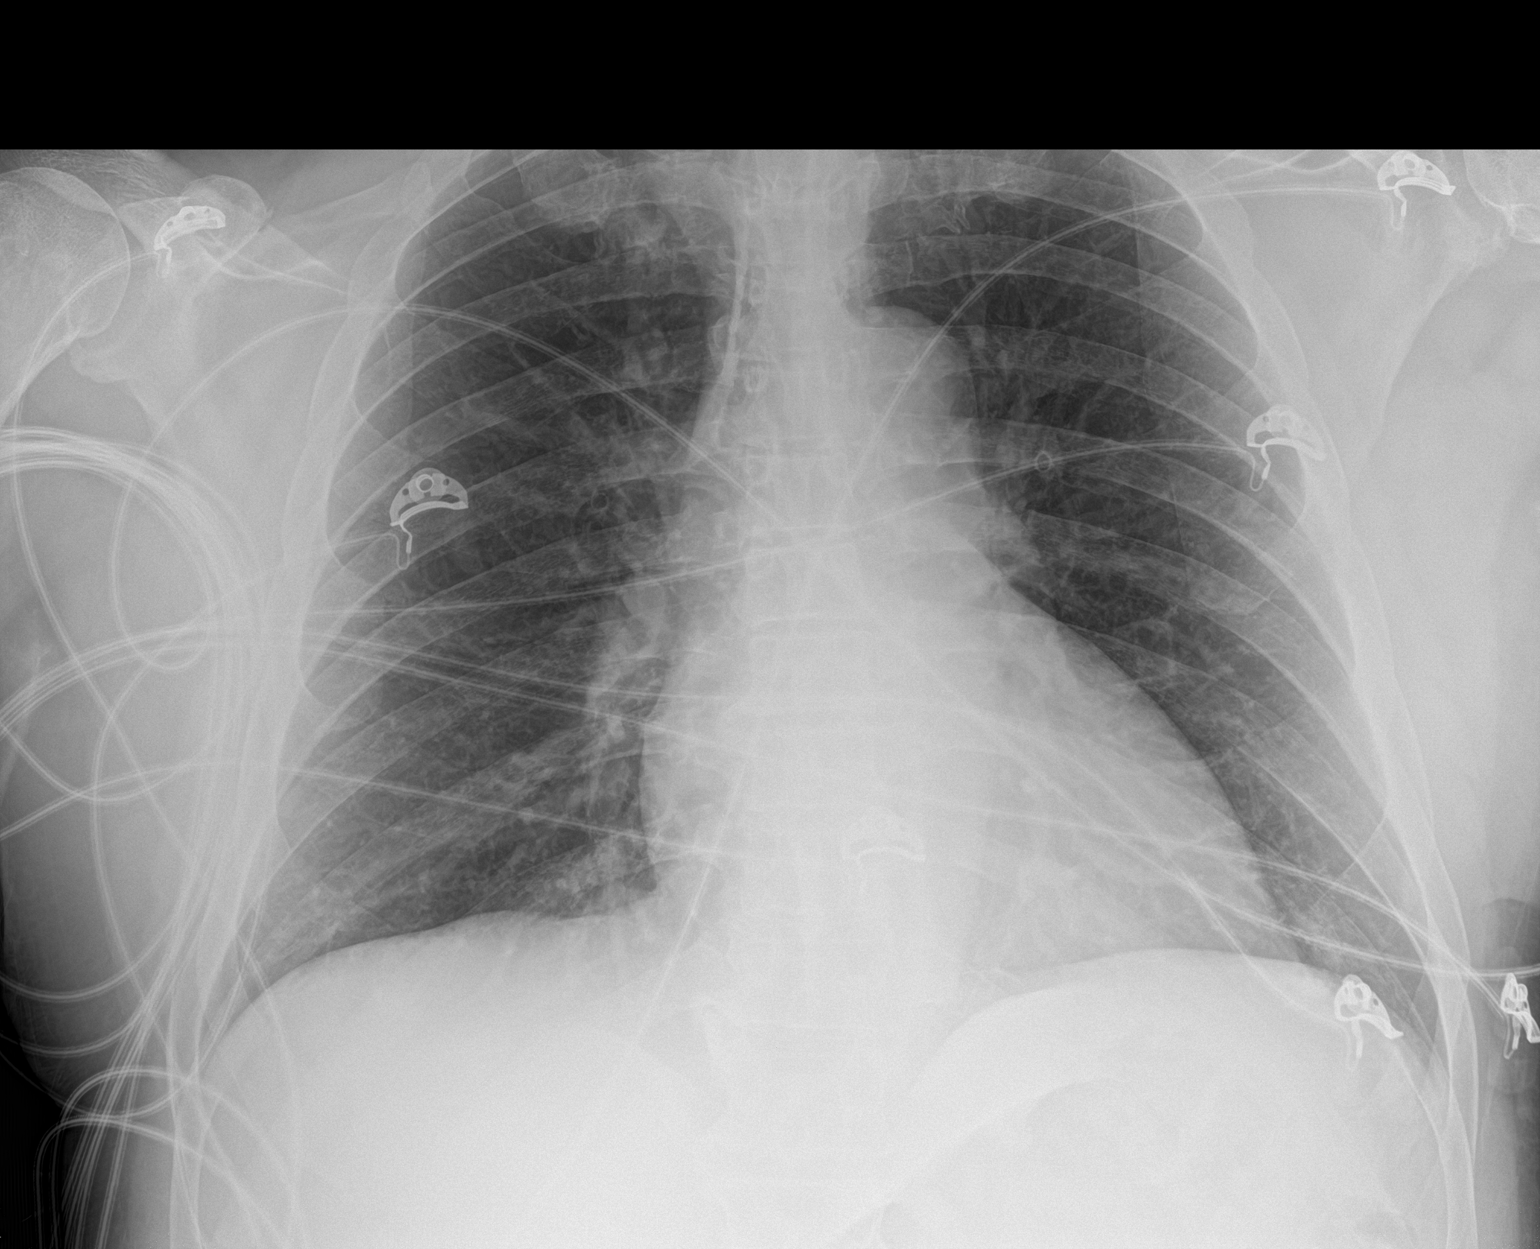

[2 of 2 positions shown; findings below may reference images not displayed]

FINDINGS: The heart size and mediastinal contours are within normal limits.
Both lungs are clear. The visualized skeletal structures are
unremarkable.
IMPRESSION: No active disease.

## 2020-08-12 ENCOUNTER — Other Ambulatory Visit: Payer: Self-pay | Admitting: Family Medicine

## 2020-08-12 DIAGNOSIS — R634 Abnormal weight loss: Secondary | ICD-10-CM

## 2020-08-12 DIAGNOSIS — R109 Unspecified abdominal pain: Secondary | ICD-10-CM

## 2020-09-04 ENCOUNTER — Ambulatory Visit
Admission: RE | Admit: 2020-09-04 | Discharge: 2020-09-04 | Disposition: A | Discharge status: Home / Self Care | Payer: Managed Care, Other (non HMO) | Source: Ambulatory Visit | Attending: Family Medicine | Admitting: Family Medicine

## 2020-09-04 DIAGNOSIS — R634 Abnormal weight loss: Secondary | ICD-10-CM

## 2020-09-04 DIAGNOSIS — R109 Unspecified abdominal pain: Secondary | ICD-10-CM

## 2020-09-04 MED ORDER — IOPAMIDOL (ISOVUE-300) INJECTION 61%
100.0000 mL | Freq: Once | INTRAVENOUS | Status: AC | PRN
  Administered 2020-09-04: 100 mL via INTRAVENOUS

## 2021-09-08 ENCOUNTER — Other Ambulatory Visit: Payer: Self-pay | Admitting: Physician Assistant

## 2021-09-08 DIAGNOSIS — F17200 Nicotine dependence, unspecified, uncomplicated: Secondary | ICD-10-CM

## 2021-10-22 ENCOUNTER — Inpatient Hospital Stay: Admission: RE | Admit: 2021-10-22 | Payer: BC Managed Care – PPO | Source: Ambulatory Visit

## 2024-01-08 ENCOUNTER — Other Ambulatory Visit: Payer: Self-pay | Admitting: Nurse Practitioner

## 2024-01-08 DIAGNOSIS — F17200 Nicotine dependence, unspecified, uncomplicated: Secondary | ICD-10-CM
